# Patient Record
Sex: Female | Born: 1975 | Race: Black or African American | Hispanic: No | Marital: Single | Smoking: Never smoker
Health system: Southern US, Community
[De-identification: ages and names within clinical notes are randomized; demographics above are authoritative.]

---

## 1999-08-28 ENCOUNTER — Encounter: Payer: Self-pay | Admitting: Emergency Medicine

## 1999-08-28 ENCOUNTER — Emergency Department (HOSPITAL_COMMUNITY): Admission: EM | Admit: 1999-08-28 | Discharge: 1999-08-28 | Payer: Self-pay | Admitting: Emergency Medicine

## 1999-09-05 ENCOUNTER — Emergency Department (HOSPITAL_COMMUNITY): Admission: EM | Admit: 1999-09-05 | Discharge: 1999-09-05 | Payer: Self-pay | Admitting: Emergency Medicine

## 2014-06-21 ENCOUNTER — Ambulatory Visit (INDEPENDENT_AMBULATORY_CARE_PROVIDER_SITE_OTHER): Payer: BC Managed Care – PPO | Admitting: Family Medicine

## 2014-06-21 VITALS — BP 130/78 | HR 87 | Temp 97.9°F | Resp 17 | Ht 67.0 in | Wt 184.8 lb

## 2014-06-21 DIAGNOSIS — T148 Other injury of unspecified body region: Secondary | ICD-10-CM

## 2014-06-21 DIAGNOSIS — W57XXXA Bitten or stung by nonvenomous insect and other nonvenomous arthropods, initial encounter: Secondary | ICD-10-CM | POA: Diagnosis not present

## 2014-06-21 DIAGNOSIS — T63421A Toxic effect of venom of ants, accidental (unintentional), initial encounter: Secondary | ICD-10-CM

## 2014-06-21 MED ORDER — TRIAMCINOLONE ACETONIDE 0.1 % EX CREA: 1.0000 "application " | TOPICAL_CREAM | Freq: Three times a day (TID) | CUTANEOUS | Status: AC

## 2014-06-21 MED ORDER — PREDNISONE 20 MG PO TABS: ORAL_TABLET | ORAL | Status: DC

## 2014-06-21 NOTE — Patient Instructions (Signed)
Take the prednisone 3 pills daily for 4 days  Take over-the-counter Zyrtec (cetirizine) as per directions on the container  Use the triamcinolone cream on the bites 2 or 3 times daily.  If you see a red streaks going up the arm, the hand getting redder, or have other concerns please contact us or return

## 2014-06-21 NOTE — Progress Notes (Signed)
  Subjective:  Patient ID: Angie Rodriguez, female    DOB: [birth year]  Age: 39 y.o. MRN: [id number]  39 year old lady who was working in her yard this weekend and got stung on the right hand by several fire ants. She knows she got at least 3 stings. She was moving a piece of wooden noticed them on her hands but it was too late. She has swollen in her right hand and it felt hot to touch. Now it is starting to itch more. She did take Benadryl. She also put ice to it. No history of prior stings.  Assessment cycle was one week ago and patient is not pregnant. She is not diabetic. She is not on any regular medicines.   Objective:   Pleasant lady alert and oriented no acute distress except for the hand bothering her. She has swelling of the right hand in primarily on the dorsum of the hand but down into the fingers some also. There are at least 3 distinct bites, one on the knuckles, one on the back of the right wrist and one on the front of the right wrist it is warm to touch. No ascending erythema. No axillary nodes.  Assessment & Plan:   Assessment: Fire ants stings  Plan: Treat with prednisone and triamcinolone cream and cetirizine Patient Instructions  Take the prednisone 3 pills daily for 4 days  Take over-the-counter Zyrtec (cetirizine) as per directions on the container  Use the triamcinolone cream on the bites 2 or 3 times daily.  If you see a red streaks going up the arm, the hand getting redder, or have other concerns please contact us or return     Angie Rotz, MD 06/21/2014

## 2014-12-07 ENCOUNTER — Ambulatory Visit (INDEPENDENT_AMBULATORY_CARE_PROVIDER_SITE_OTHER): Payer: BC Managed Care – PPO | Admitting: Physician Assistant

## 2014-12-07 VITALS — BP 124/86 | HR 79 | Temp 98.2°F | Resp 16 | Ht 67.0 in | Wt 188.8 lb

## 2014-12-07 DIAGNOSIS — M6740 Ganglion, unspecified site: Secondary | ICD-10-CM | POA: Diagnosis not present

## 2014-12-07 LAB — POCT SEDIMENTATION RATE: POCT SED RATE: 3 mm/hr (ref 0–22)

## 2014-12-07 LAB — C-REACTIVE PROTEIN: CRP: <0.5 mg/dL (ref <0.60)

## 2014-12-07 NOTE — Progress Notes (Signed)
   12/07/2014 10:18 AM   DOB: 02/03/1991 / MRN: [id number]  SUBJECTIVE:  Angie SorrowRachel Sopka is a 39 y.o. female presenting for a painless mass on her posterior left hand that started 4 days ago.  She denies pain of the mass, paresthesia distal to the injury, and any change in function of the hand.  She has never had this problem before and is concerned.  The lump was preceded with yard work on Saturday, which is not new for her.  No insect bites and no pruritis.   She has no allergies on file.   She  has no past medical history on file.    She   She  has no sexual activity history on file. The patient  has no past surgical history on file.  Her family history is not on file.  Review of Systems  Constitutional: Negative for fever and chills.  Eyes: Negative for blurred vision.  Respiratory: Negative for cough and shortness of breath.   Cardiovascular: Negative for chest pain.  Gastrointestinal: Negative for nausea and abdominal pain.  Genitourinary: Negative for dysuria, urgency and frequency.  Musculoskeletal: Negative for myalgias and joint pain.  Skin: Negative for rash.  Neurological: Negative for dizziness, tingling and headaches.  Psychiatric/Behavioral: Negative for depression. The patient is not nervous/anxious.      Problem list and medications reviewed and updated by myself where necessary, and exist elsewhere in the encounter.     OBJECTIVE:  Filed Vitals:   12/07/14 1006  BP: 124/86  Pulse: 79  Temp: 98.2 F (36.8 C)  Resp: 16   Physical Exam  Constitutional: She is oriented to person, place, and time. She appears well-developed.  Eyes: EOM are normal. Pupils are equal, round, and reactive to light.  Cardiovascular: Normal rate.   Pulmonary/Chest: Effort normal.  Abdominal: She exhibits no distension.  Musculoskeletal: Normal range of motion.       Left wrist: She exhibits normal range of motion, no tenderness, no bony tenderness, no swelling, no effusion, no  crepitus and no deformity.       Left hand: She exhibits normal range of motion, no tenderness, no bony tenderness, no deformity and no laceration. Normal sensation noted. Normal strength noted.  Neurological: She is alert and oriented to person, place, and time. No cranial nerve deficit.  Skin: Skin is warm and dry. She is not diaphoretic.  Psychiatric: She has a normal mood and affect.  Vitals reviewed.     There were no vitals taken for this visit. CrCl cannot be calculated (Unknown ideal weight.).  Physical Exam  No results found for this or any previous visit (from the past 48 hour(s)).  ASSESSMENT AND PLAN  Diagnoses and all orders for this visit:  Ganglion cyst: Painless mass appearing after physical activity 4 days ago.  There is no tenderness, pulses 2+ at radial and ulnar.  No paresthesia and wrist and hand are normal.  Advised that we give this more time.  Will ultrasound the mass if she becomes concerned.      The patient was advised to call or return to clinic if she does not see an improvement in symptoms or to seek the care of the closest emergency department if she worsens with the above plan.   Deliah BostonMichael Caedin Mogan, MHS, PA-C [facility] 12/07/2014 10:18 AM

## 2016-05-30 ENCOUNTER — Encounter: Payer: Self-pay | Admitting: Physician Assistant

## 2016-05-30 ENCOUNTER — Ambulatory Visit (INDEPENDENT_AMBULATORY_CARE_PROVIDER_SITE_OTHER): Payer: BC Managed Care – PPO | Admitting: Physician Assistant

## 2016-05-30 VITALS — BP 138/100 | HR 104 | Temp 98.4°F | Resp 16 | Ht 65.55 in | Wt 192.6 lb

## 2016-05-30 DIAGNOSIS — K529 Noninfective gastroenteritis and colitis, unspecified: Secondary | ICD-10-CM | POA: Diagnosis not present

## 2016-05-30 DIAGNOSIS — R197 Diarrhea, unspecified: Secondary | ICD-10-CM | POA: Diagnosis not present

## 2016-05-30 DIAGNOSIS — R112 Nausea with vomiting, unspecified: Secondary | ICD-10-CM | POA: Diagnosis not present

## 2016-05-30 LAB — POCT CBC
Granulocyte percent: 67.3 %G (ref 37–80)
HCT, POC: 40.1 % (ref 37.7–47.9)
Hemoglobin: 13.9 g/dL (ref 12.2–16.2)
Lymph, poc: 2.4 (ref 0.6–3.4)
MCH, POC: 28.2 pg (ref 27–31.2)
MCHC: 34.8 g/dL (ref 31.8–35.4)
MCV: 81 fL (ref 80–97)
MID (cbc): 0.2 (ref 0–0.9)
MPV: 7.4 fL (ref 0–99.8)
POC Granulocyte: 5.3 (ref 2–6.9)
POC LYMPH PERCENT: 29.8 %L (ref 10–50)
POC MID %: 2.9 %M (ref 0–12)
Platelet Count, POC: 389 10*3/uL (ref 142–424)
RBC: 4.94 M/uL (ref 4.04–5.48)
RDW, POC: 12.8 %
WBC: 7.9 10*3/uL (ref 4.6–10.2)

## 2016-05-30 LAB — POCT URINALYSIS DIP (MANUAL ENTRY)
Bilirubin, UA: NEGATIVE
Blood, UA: NEGATIVE
Glucose, UA: NEGATIVE mg/dL
Ketones, POC UA: NEGATIVE mg/dL
Leukocytes, UA: NEGATIVE
Nitrite, UA: NEGATIVE
Protein Ur, POC: NEGATIVE mg/dL
Spec Grav, UA: 1.015 (ref 1.010–1.025)
Urobilinogen, UA: 0.2 E.U./dL
pH, UA: 6 (ref 5.0–8.0)

## 2016-05-30 LAB — POCT URINE PREGNANCY: Preg Test, Ur: NEGATIVE

## 2016-05-30 MED ORDER — ONDANSETRON 8 MG PO TBDP: 8.0000 mg | ORAL_TABLET | Freq: Three times a day (TID) | ORAL | 0 refills | Status: AC | PRN

## 2016-05-30 NOTE — Progress Notes (Signed)
PRIMARY CARE AT [facility] [address] [place] [address] [phone]  Date:  05/30/2016   Name:  Angie Rodriguez   DOB:  05/11/1975   MRN:  [id number]  PCP:  Patient, No Pcp Per    History of Present Illness:  Angie Rodriguez is a 41 y.o. female patient who presents to PCP with  Chief Complaint  Patient presents with  . Dizziness    began yesterday morning  . Emesis    began yesterday morning     Dizzy yesterday morning, and ran to the bathroom to throw up.  She felt better by the late afternoon.  When she would get up she would feel dizzy.  The dizziness dissipated today.  She had a hot dog yesterday morning.  No bloody emesis.  She had multiple bowel movements but no loose stool.  No abdominal pain.  No hematuria, dysuria, or frequency.   Sexually active with protection.   Patient's last menstrual period was 05/17/2016. She was able to tolerate foods by the evening.     There are no active problems to display for this patient.   No past medical history on file.  No past surgical history on file.  Social History  Substance Use Topics  . Smoking status: Never Smoker  . Smokeless tobacco: Never Used  . Alcohol use No    Family History  Problem Relation Age of Onset  . Hypertension Mother   . Stroke Mother   . Heart disease Father     No Known Allergies  Medication list has been reviewed and updated.  Current Outpatient Prescriptions on File Prior to Visit  Medication Sig Dispense Refill  . triamcinolone cream (KENALOG) 0.1 % Apply 1 application topically 3 (three) times daily. (Patient not taking: Reported on 12/07/2014) 30 g 0   No current facility-administered medications on file prior to visit.     ROS ROS otherwise unremarkable unless listed above.  Physical Examination: BP (!) 138/100 (BP Location: Right Arm, Patient Position: Sitting, Cuff Size: Normal)   Pulse (!) 104   Temp 98.4 F (36.9 C) (Oral)   Resp 16   Ht 5' 5.55" (1.665 m)    Wt 192 lb 9.6 oz (87.4 kg)   LMP 05/17/2016   SpO2 96%   BMI 31.51 kg/m  Ideal Body Weight: Weight in (lb) to have BMI = 25: 152.5  Physical Exam  Constitutional: She is oriented to person, place, and time. She appears well-developed and well-nourished. No distress.  HENT:  Head: Normocephalic and atraumatic.  Right Ear: External ear normal.  Left Ear: External ear normal.  Eyes: Conjunctivae and EOM are normal. Pupils are equal, round, and reactive to light.  Cardiovascular: Normal rate.   Pulmonary/Chest: Effort normal. No respiratory distress.  Abdominal: Soft. Normal appearance and bowel sounds are normal. There is no hepatosplenomegaly. There is no tenderness. There is no CVA tenderness.  Neurological: She is alert and oriented to person, place, and time.  Skin: She is not diaphoretic.  Psychiatric: She has a normal mood and affect. Her behavior is normal.   Results for orders placed or performed in visit on 05/30/16  POCT urinalysis dipstick  Result Value Ref Range   Color, UA yellow yellow   Clarity, UA clear clear   Glucose, UA negative negative mg/dL   Bilirubin, UA negative negative   Ketones, POC UA negative negative mg/dL   Spec Grav, UA 5.784 6.962 - 1.025   Blood, UA negative negative  pH, UA 6.0 5.0 - 8.0   Protein Ur, POC negative negative mg/dL   Urobilinogen, UA 0.2 0.2 or 1.0 E.U./dL   Nitrite, UA Negative Negative   Leukocytes, UA Negative Negative  POCT urine pregnancy  Result Value Ref Range   Preg Test, Ur Negative Negative  POCT CBC  Result Value Ref Range   WBC 7.9 4.6 - 10.2 K/uL   Lymph, poc 2.4 0.6 - 3.4   POC LYMPH PERCENT 29.8 10 - 50 %L   MID (cbc) 0.2 0 - 0.9   POC MID % 2.9 0 - 12 %M   POC Granulocyte 5.3 2 - 6.9   Granulocyte percent 67.3 37 - 80 %G   RBC 4.94 4.04 - 5.48 M/uL   Hemoglobin 13.9 12.2 - 16.2 g/dL   HCT, POC 16.140.1 09.637.7 - 47.9 %   MCV 81.0 80 - 97 fL   MCH, POC 28.2 27 - 31.2 pg   MCHC 34.8 31.8 - 35.4 g/dL   RDW,  POC 04.512.8 %   Platelet Count, POC 389 142 - 424 K/uL   MPV 7.4 0 - 99.8 fL     Assessment and Plan: Angie Rodriguez is a 41 y.o. female who is here today or dizziness and nausea Likely a gastroenteritis.   Will return to the clinic if her symptoms are not improving in 72 hours.   Discussed alarming sxs to warrant an immediate return.   Gastroenteritis  Nausea and vomiting, intractability of vomiting not specified, unspecified vomiting type - Plan: POCT urinalysis dipstick, POCT urine pregnancy, POCT CBC, ondansetron (ZOFRAN-ODT) 8 MG disintegrating tablet  Trena PlattStephanie Maxfield Gildersleeve, PA-C [facility] 5/19/20189:29 AM

## 2016-05-30 NOTE — Patient Instructions (Addendum)
IF you received an x-ray today, you will receive an invoice from [facility]. Please contact [facility] at [phone] with questions or concerns regarding your invoice.   IF you received labwork today, you will receive an invoice from [facility]. Please contact [facility] at [phone] with questions or concerns regarding your invoice.   Our billing staff will not be able to assist you with questions regarding bills from these companies.  You will be contacted with the lab results as soon as they are available. The fastest way to get your results is to activate your My Chart account. Instructions are located on the last page of this paperwork. If you have not heard from us regarding the results in 2 weeks, please contact this office.    Continue to hydrate well with water. Viral Gastroenteritis, Adult Viral gastroenteritis is also known as the stomach flu. This condition is caused by certain germs (viruses). These germs can be passed from person to person very easily (are very contagious). This condition can cause sudden watery poop (diarrhea), fever, and throwing up (vomiting). Having watery poop and throwing up can make you feel weak and cause you to get dehydrated. Dehydration can make you tired and thirsty, make you have a dry mouth, and make it so you pee (urinate) less often. Older adults and people with other diseases or a weak defense system (immune system) are at higher risk for dehydration. It is important to replace the fluids that you lose from having watery poop and throwing up. Follow these instructions at home: Follow instructions from your doctor about how to care for yourself at home. Eating and drinking   Follow these instructions as told by your doctor:  Take an oral rehydration solution (ORS). This is a drink that is sold at pharmacies and stores.  Drink clear fluids in small amounts as you are able, such as:  Water.  Ice chips.  Diluted fruit  juice.  Low-calorie sports drinks.  Eat bland, easy-to-digest foods in small amounts as you are able, such as:  Bananas.  Applesauce.  Rice.  Low-fat (lean) meats.  Toast.  Crackers.  Avoid fluids that have a lot of sugar or caffeine in them.  Avoid alcohol.  Avoid spicy or fatty foods. General instructions   Drink enough fluid to keep your pee (urine) clear or pale yellow.  Wash your hands often. If you cannot use soap and water, use hand sanitizer.  Make sure that all people in your home wash their hands well and often.  Rest at home while you get better.  Take over-the-counter and prescription medicines only as told by your doctor.  Watch your condition for any changes.  Take a warm bath to help with any burning or pain from having watery poop.  Keep all follow-up visits as told by your doctor. This is important. Contact a doctor if:  You cannot keep fluids down.  Your symptoms get worse.  You have new symptoms.  You feel light-headed or dizzy.  You have muscle cramps. Get help right away if:  You have chest pain.  You feel very weak or you pass out (faint).  You see blood in your throw-up.  Your throw-up looks like coffee grounds.  You have bloody or black poop (stools) or poop that look like tar.  You have a very bad headache, a stiff neck, or both.  You have a rash.  You have very bad pain, cramping, or bloating in your belly (abdomen).  You have trouble breathing.  You are breathing very quickly.  Your heart is beating very quickly.  Your skin feels cold and clammy.  You feel confused.  You have pain when you pee.  You have signs of dehydration, such as:  Dark pee, hardly any pee, or no pee.  Cracked lips.  Dry mouth.  Sunken eyes.  Sleepiness.  Weakness. This information is not intended to replace advice given to you by your health care provider. Make sure you discuss any questions you have with your health care  provider. Document Released: 06/19/2007 Document Revised: 07/21/2015 Document Reviewed: 09/06/2014 Elsevier Interactive Patient Education  2017 [employer].

## 2017-04-15 ENCOUNTER — Encounter: Payer: Self-pay | Admitting: Physician Assistant

## 2017-09-15 ENCOUNTER — Ambulatory Visit (HOSPITAL_COMMUNITY)
Admission: EM | Admit: 2017-09-15 | Discharge: 2017-09-15 | Disposition: A | Discharge status: Home / Self Care | Payer: BC Managed Care – PPO | Attending: Family Medicine | Admitting: Family Medicine

## 2017-09-15 ENCOUNTER — Other Ambulatory Visit: Payer: Self-pay | Admitting: Family Medicine

## 2017-09-15 ENCOUNTER — Encounter (HOSPITAL_COMMUNITY): Payer: Self-pay | Admitting: Emergency Medicine

## 2017-09-15 DIAGNOSIS — R030 Elevated blood-pressure reading, without diagnosis of hypertension: Secondary | ICD-10-CM

## 2017-09-15 DIAGNOSIS — N63 Unspecified lump in unspecified breast: Secondary | ICD-10-CM

## 2017-09-15 DIAGNOSIS — N6310 Unspecified lump in the right breast, unspecified quadrant: Secondary | ICD-10-CM

## 2017-09-15 NOTE — Discharge Instructions (Signed)
It was nice seeing you today. Please call breast center imaging to schedule diagnostic mammogram. I have placed an order. Please establish PCP care soon for BP and breast imaging result management.

## 2017-09-15 NOTE — ED Provider Notes (Addendum)
[facility]    CSN: [id number] Arrival date & time: 09/15/17  7829     History   Chief Complaint Chief Complaint  Patient presents with  . Mass    HPI Angie Rodriguez is a 42 y.o. female.   The history is provided by the patient. No language interpreter was used.  Breast lesion: Lump on her right breast x 2 days. She noticed it after she was outside in the yard working. She denies insect bite, no trauma to her breast, no pain, no redness. Denies hx of breast disorder, no family hx of breast cancer. Elevated BP: denies hx of HTN. No Neuro, cardiopulmonary symptoms at the moment.     No past medical history on file.  There are no active problems to display for this patient.   No past surgical history on file.  OB History   None      Home Medications    Prior to Admission medications   Medication Sig Start Date End Date Taking? Authorizing Provider  ondansetron (ZOFRAN-ODT) 8 MG disintegrating tablet Take 1 tablet (8 mg total) by mouth every 8 (eight) hours as needed for nausea. 05/30/16   Trena Platt D, PA  triamcinolone cream (KENALOG) 0.1 % Apply 1 application topically 3 (three) times daily. Patient not taking: Reported on 12/07/2014 06/21/14   Peyton Najjar, MD    Family History Family History  Problem Relation Age of Onset  . Hypertension Mother   . Stroke Mother   . Heart disease Father     Social History Social History   Tobacco Use  . Smoking status: Never Smoker  . Smokeless tobacco: Never Used  Substance Use Topics  . Alcohol use: No    Alcohol/week: 0.0 standard drinks  . Drug use: No     Allergies   Patient has no known allergies.   Review of Systems Review of Systems  Constitutional: Negative.   Respiratory: Negative.   Cardiovascular: Negative.   Gastrointestinal: Negative.   Genitourinary: Negative.   Skin:       Right breast lump  Neurological: Negative.   Hematological: Negative.     Psychiatric/Behavioral:       Nervous being in the [facility]  All other systems reviewed and are negative.    Physical Exam Triage Vital Signs ED Triage Vitals  Enc Vitals Group     BP      Pulse      Resp      Temp      Temp src      SpO2      Weight      Height      Head Circumference      Peak Flow      Pain Score      Pain Loc      Pain Edu?      Excl. in GC?    No data found.  Updated Vital Signs There were no vitals taken for this visit.  Visual Acuity Right Eye Distance:   Left Eye Distance:   Bilateral Distance:    Right Eye Near:   Left Eye Near:    Bilateral Near:     Physical Exam  Constitutional: She is oriented to person, place, and time. No distress.  Cardiovascular: Normal rate, regular rhythm and normal heart sounds.  No murmur heard. Pulmonary/Chest: Effort normal and breath sounds normal. No stridor. No respiratory distress. She has no wheezes. She exhibits no mass and no tenderness.  Right breast exhibits mass. Right breast exhibits no inverted nipple, no nipple discharge, no skin change and no tenderness. Left breast exhibits no inverted nipple, no mass, no nipple discharge, no skin change and no tenderness. No breast swelling, tenderness, discharge or bleeding. Breasts are symmetrical.    Abdominal: Soft. Bowel sounds are normal. She exhibits no distension.  Musculoskeletal: She exhibits no edema.  Neurological: She is alert and oriented to person, place, and time. No cranial nerve deficit.  Nursing note and vitals reviewed.    UC Treatments / Results  Labs (all labs ordered are listed, but only abnormal results are displayed) Labs Reviewed - No data to display  EKG None  Radiology No results found.  Procedures Procedures (including critical care time)  Medications Ordered in UC Medications - No data to display  Initial Impression / Assessment and Plan / UC Course  I have reviewed the triage vital signs and the nursing  notes.  Pertinent labs & imaging results that were available during my care of the patient were reviewed by me and considered in my medical decision making (see chart for details).  Clinical Course as of Sep 15 1052  Mon Sep 15, 2017  1045 Right breast lump. R/O Malignancy. Breast imaging recommended. Order placed. She is instructed to call breast imaging center to schedule mammogram. F/U with [place] as needed.   [KE]  1045 BP persistently elevated but improved some after rest., Repeat BP by me : 153/100 then 156/104. She stated she was diagnosed with elevated BP in the past and was not treated. She also feels anxious now. No neurologic symptoms or cardiopulm symptoms. Unclear if elevated BP is due to anxiety. Monitor BP closely at home. See PCP or urgent care in 1 week with BP dairy. Consider antihypertensive agents if persistently elevated.   [KE]    Clinical Course User Index [KE] Doreene Eland, MD    Breast lump in female  Elevated blood pressure reading   Final Clinical Impressions(s) / UC Diagnoses   Final diagnoses:  None   Discharge Instructions   None    ED Prescriptions    None     Controlled Substance Prescriptions [place] Controlled Substance Registry consulted? Not Applicable   Doreene Eland, MD 09/15/17 1054    Doreene Eland, MD 09/15/17 1353

## 2017-09-15 NOTE — ED Triage Notes (Signed)
Pt states she was out in the yard Saturday with a tank top on and something bit her on top of her L breast.

## 2017-09-16 ENCOUNTER — Other Ambulatory Visit: Payer: Self-pay | Admitting: Family Medicine

## 2017-09-16 DIAGNOSIS — N6310 Unspecified lump in the right breast, unspecified quadrant: Secondary | ICD-10-CM

## 2017-09-22 ENCOUNTER — Telehealth: Payer: Self-pay | Admitting: Family Medicine

## 2017-09-22 ENCOUNTER — Ambulatory Visit
Admission: RE | Admit: 2017-09-22 | Discharge: 2017-09-22 | Disposition: A | Discharge status: Home / Self Care | Payer: BC Managed Care – PPO | Source: Ambulatory Visit | Attending: Family Medicine | Admitting: Family Medicine

## 2017-09-22 ENCOUNTER — Other Ambulatory Visit: Payer: Self-pay | Admitting: Family Medicine

## 2017-09-22 DIAGNOSIS — N6310 Unspecified lump in the right breast, unspecified quadrant: Secondary | ICD-10-CM

## 2017-09-22 NOTE — Telephone Encounter (Signed)
Unable to reach and message not left on her phone.  Note: Mammogram result is normal.   I had discussed with her during her visit to urgent care that she need to establish PCP to follow her management. She is aware that her subsequent referral and imaging will not come from our office since I am not her PCP. I will send a reminder letter t her.

## 2017-09-23 ENCOUNTER — Encounter: Payer: Self-pay | Admitting: Family Medicine

## 2017-09-23 NOTE — Progress Notes (Signed)
Unable to reach and message not left on her phone.  Note: Mammogram result is normal.   I had discussed with her during her visit to urgent care that she need to establish PCP to follow her management. She is aware that her subsequent referral and imaging will not come from our office since I am not her PCP. I will send a reminder letter t her. 

## 2017-12-23 ENCOUNTER — Other Ambulatory Visit: Payer: Self-pay | Admitting: Family Medicine

## 2017-12-23 ENCOUNTER — Ambulatory Visit
Admission: RE | Admit: 2017-12-23 | Discharge: 2017-12-23 | Disposition: A | Discharge status: Home / Self Care | Payer: BC Managed Care – PPO | Source: Ambulatory Visit | Attending: Family Medicine | Admitting: Family Medicine

## 2017-12-23 DIAGNOSIS — N6310 Unspecified lump in the right breast, unspecified quadrant: Secondary | ICD-10-CM

## 2018-03-24 ENCOUNTER — Other Ambulatory Visit: Payer: BC Managed Care – PPO

## 2018-03-26 ENCOUNTER — Other Ambulatory Visit: Payer: BC Managed Care – PPO

## 2018-04-24 ENCOUNTER — Ambulatory Visit
Admission: RE | Admit: 2018-04-24 | Discharge: 2018-04-24 | Disposition: A | Discharge status: Home / Self Care | Payer: BC Managed Care – PPO | Source: Ambulatory Visit | Attending: Family Medicine | Admitting: Family Medicine

## 2018-04-24 ENCOUNTER — Other Ambulatory Visit: Payer: Self-pay | Admitting: Family Medicine

## 2018-04-24 ENCOUNTER — Other Ambulatory Visit: Payer: Self-pay

## 2018-04-24 ENCOUNTER — Other Ambulatory Visit: Payer: BC Managed Care – PPO

## 2018-04-24 DIAGNOSIS — N6310 Unspecified lump in the right breast, unspecified quadrant: Secondary | ICD-10-CM

## 2018-04-24 DIAGNOSIS — M7989 Other specified soft tissue disorders: Secondary | ICD-10-CM

## 2018-09-28 ENCOUNTER — Other Ambulatory Visit: Payer: Self-pay

## 2018-09-28 ENCOUNTER — Ambulatory Visit
Admission: RE | Admit: 2018-09-28 | Discharge: 2018-09-28 | Disposition: A | Discharge status: Home / Self Care | Payer: BC Managed Care – PPO | Source: Ambulatory Visit | Attending: Family Medicine | Admitting: Family Medicine

## 2018-09-28 DIAGNOSIS — M7989 Other specified soft tissue disorders: Secondary | ICD-10-CM

## 2018-10-18 ENCOUNTER — Encounter (HOSPITAL_COMMUNITY): Payer: Self-pay

## 2018-10-18 ENCOUNTER — Ambulatory Visit (HOSPITAL_COMMUNITY)
Admission: EM | Admit: 2018-10-18 | Discharge: 2018-10-18 | Disposition: A | Discharge status: Home / Self Care | Payer: BC Managed Care – PPO

## 2018-10-18 ENCOUNTER — Other Ambulatory Visit: Payer: Self-pay

## 2018-10-18 DIAGNOSIS — T1591XA Foreign body on external eye, part unspecified, right eye, initial encounter: Secondary | ICD-10-CM | POA: Diagnosis not present

## 2018-10-18 MED ORDER — FLUORESCEIN SODIUM 1 MG OP STRP
ORAL_STRIP | OPHTHALMIC | Status: AC
  Filled 2018-10-18: qty 1

## 2018-10-18 MED ORDER — TETRACAINE HCL 0.5 % OP SOLN
OPHTHALMIC | Status: AC
  Filled 2018-10-18: qty 4

## 2018-10-18 NOTE — ED Triage Notes (Signed)
Pt states she's not sure if she has a lost contact in her right eye. Pt states she's not sure if  It's still there or not.

## 2018-10-18 NOTE — ED Provider Notes (Signed)
[facility]    CSN: [id number] Arrival date & time: 10/18/18  1008      History   Chief Complaint Chief Complaint  Patient presents with  . Eye Pain    HPI Angie Rodriguez is a 43 y.o. female.   Patient here concerned with "contact lost in R eye" x this morning. She reports she accidentally fell asleep in her contacts, she was able to find and remove contact from L eye at home but could not find R contact.  She denies foreign body sensation, pain, tenderness, blurry vision, double vision.  She is wearing her glasses.       History reviewed. No pertinent past medical history.  There are no active problems to display for this patient.   History reviewed. No pertinent surgical history.  OB History   No obstetric history on file.      Home Medications    Prior to Admission medications   Medication Sig Start Date End Date Taking? Authorizing Provider  ondansetron (ZOFRAN-ODT) 8 MG disintegrating tablet Take 1 tablet (8 mg total) by mouth every 8 (eight) hours as needed for nausea. 05/30/16   Trena PlattEnglish, Stephanie D, PA  triamcinolone cream (KENALOG) 0.1 % Apply 1 application topically 3 (three) times daily. Patient not taking: Reported on 12/07/2014 06/21/14   Peyton NajjarHopper, David H, MD    Family History Family History  Problem Relation Age of Onset  . Hypertension Mother   . Stroke Mother   . Heart disease Father   . Breast cancer Neg Hx     Social History Social History   Tobacco Use  . Smoking status: Never Smoker  . Smokeless tobacco: Never Used  Substance Use Topics  . Alcohol use: No    Alcohol/week: 0.0 standard drinks  . Drug use: No     Allergies   Patient has no known allergies.   Review of Systems Review of Systems  Constitutional: Negative for activity change, appetite change and fatigue.  HENT: Negative for rhinorrhea and sore throat.   Eyes: Positive for redness. Negative for photophobia, pain, discharge, itching and visual  disturbance.  Respiratory: Negative for cough, shortness of breath and wheezing.   Cardiovascular: Negative for chest pain.  Gastrointestinal: Negative for nausea and vomiting.  Musculoskeletal: Negative for myalgias.  Skin: Negative for rash.  Allergic/Immunologic: Negative for immunocompromised state.  Hematological: Negative for adenopathy. Does not bruise/bleed easily.  Psychiatric/Behavioral: Negative for confusion and sleep disturbance. The patient is not nervous/anxious.      Physical Exam Triage Vital Signs ED Triage Vitals  Enc Vitals Group     BP 10/18/18 1031 (!) 154/101     Pulse Rate 10/18/18 1031 97     Resp 10/18/18 1031 16     Temp 10/18/18 1031 97.8 F (36.6 C)     Temp Source 10/18/18 1031 Temporal     SpO2 10/18/18 1031 100 %     Weight 10/18/18 1039 180 lb (81.6 kg)     Height --      Head Circumference --      Peak Flow --      Pain Score 10/18/18 1039 0     Pain Loc --      Pain Edu? --      Excl. in GC? --    No data found.  Updated Vital Signs BP (!) 154/101 (BP Location: Left Arm)   Pulse 97   Temp 97.8 F (36.6 C) (Temporal)   Resp 16  Wt 180 lb (81.6 kg)   LMP 09/28/2018   SpO2 100%   BMI 29.45 kg/m   Visual Acuity Right Eye Distance:   Left Eye Distance:   Bilateral Distance:    Right Eye Near: R Near: 20/30 with corrective lens Left Eye Near:  L Near: 20/20 with corrective lens Bilateral Near:  20/20 with corrective lens  Physical Exam Vitals signs and nursing note reviewed.  Constitutional:      General: She is not in acute distress.    Appearance: Normal appearance. She is well-developed.  HENT:     Head: Normocephalic and atraumatic.     Nose: Nose normal.     Mouth/Throat:     Mouth: Mucous membranes are moist.  Eyes:     General: Lids are normal. Lids are everted, no foreign bodies appreciated. Vision grossly intact. Gaze aligned appropriately. No allergic shiner, visual field deficit or scleral icterus.       Right  eye: No foreign body or discharge.        Left eye: No foreign body or discharge.     Extraocular Movements: Extraocular movements intact.     Right eye: No nystagmus.     Left eye: No nystagmus.     Conjunctiva/sclera:     Right eye: Right conjunctiva is injected. No chemosis.    Left eye: Left conjunctiva is not injected.     Pupils: Pupils are equal, round, and reactive to light. Pupils are equal.     Right eye: No corneal abrasion or fluorescein uptake.     Comments: Fluorescein stain and woods lamp used to examine R eye - no foreign body notes, no corneal abrasion appreciated on exam.  Neck:     Musculoskeletal: Neck supple.  Cardiovascular:     Rate and Rhythm: Normal rate and regular rhythm.     Heart sounds: No murmur.  Pulmonary:     Effort: Pulmonary effort is normal. No respiratory distress.     Breath sounds: Normal breath sounds.  Abdominal:     Palpations: Abdomen is soft.     Tenderness: There is no abdominal tenderness.  Skin:    General: Skin is warm and dry.     Capillary Refill: Capillary refill takes less than 2 seconds.  Neurological:     General: No focal deficit present.     Mental Status: She is alert and oriented to person, place, and time.  Psychiatric:        Mood and Affect: Mood normal.        Behavior: Behavior normal.      UC Treatments / Results  Labs (all labs ordered are listed, but only abnormal results are displayed) Labs Reviewed - No data to display  EKG   Radiology No results found.  Procedures Procedures (including critical care time)  Medications Ordered in UC Medications  tetracaine (PONTOCAINE) 0.5 % ophthalmic solution (has no administration in time range)  fluorescein 1 MG ophthalmic strip (has no administration in time range)    Initial Impression / Assessment and Plan / UC Course  I have reviewed the triage vital signs and the nursing notes.  Pertinent labs & imaging results that were available during my care of  the patient were reviewed by me and considered in my medical decision making (see chart for details).      Final Clinical Impressions(s) / UC Diagnoses   Final diagnoses:  Foreign body of right eye, initial encounter     Discharge Instructions  No foreign body noted - if you experience pain or discomfort in eye follow up with eye specialist.     ED Prescriptions    None     PDMP not reviewed this encounter.   Evern Core, PA-C 10/18/18 1133

## 2018-10-18 NOTE — Discharge Instructions (Addendum)
No foreign body noted - if you experience pain or discomfort in eye follow up with eye specialist.

## 2020-07-06 ENCOUNTER — Other Ambulatory Visit: Payer: Self-pay | Admitting: Family Medicine

## 2020-07-06 DIAGNOSIS — Z1231 Encounter for screening mammogram for malignant neoplasm of breast: Secondary | ICD-10-CM

## 2020-08-30 ENCOUNTER — Ambulatory Visit
Admission: RE | Admit: 2020-08-30 | Discharge: 2020-08-30 | Disposition: A | Discharge status: Home / Self Care | Payer: BC Managed Care – PPO | Source: Ambulatory Visit | Attending: Family Medicine | Admitting: Family Medicine

## 2020-08-30 ENCOUNTER — Other Ambulatory Visit: Payer: Self-pay

## 2020-08-30 DIAGNOSIS — Z1231 Encounter for screening mammogram for malignant neoplasm of breast: Secondary | ICD-10-CM

## 2022-12-03 IMAGING — MG MM DIGITAL SCREENING BILAT W/ TOMO AND CAD
6 of 10 series · 6 of 30 positions shown · non-contrast
Comparison: Previous exam(s).

ACR Breast Density Category a: The breast tissue is almost entirely
fatty.

CLINICAL DATA: Screening.

EXAM:
DIGITAL SCREENING BILATERAL MAMMOGRAM WITH TOMOSYNTHESIS AND CAD
TECHNIQUE: Bilateral screening digital craniocaudal and mediolateral oblique
mammograms were obtained. Bilateral screening digital breast
tomosynthesis was performed. The images were evaluated with
computer-aided detection.

[L MLO synth-2D]
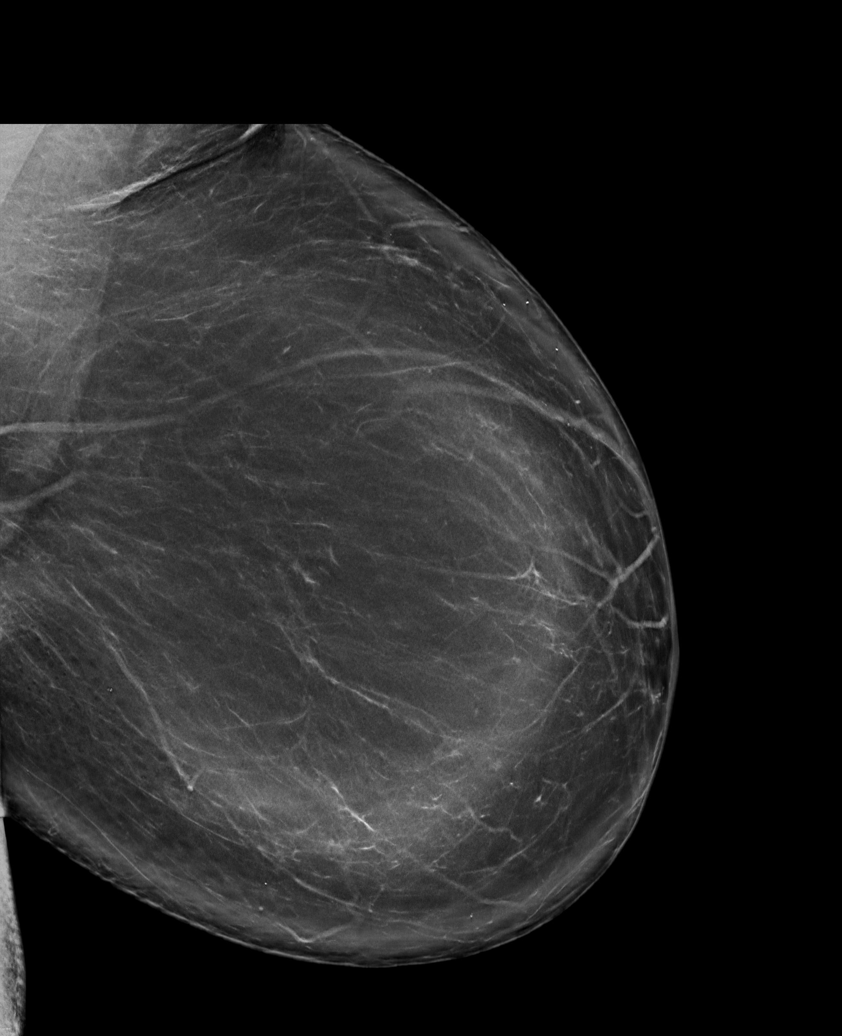

[L CC synth-2D]
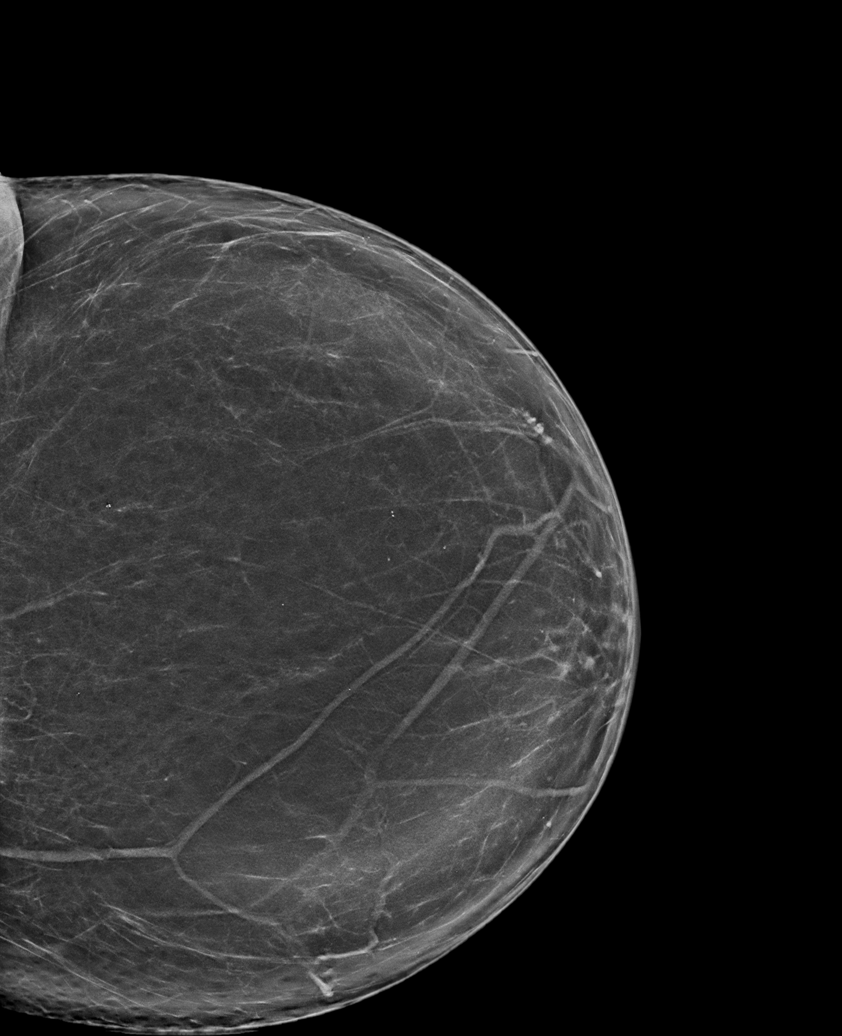

[R CV synth-2D]
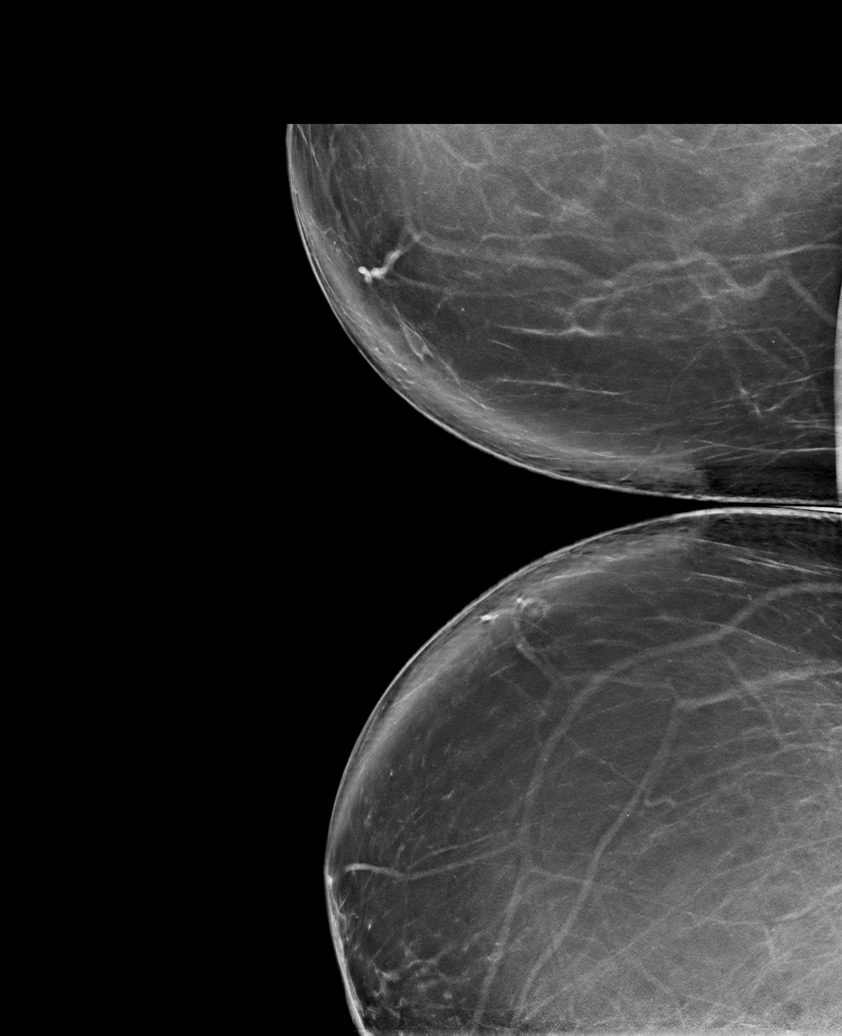

[R CC synth-2D]
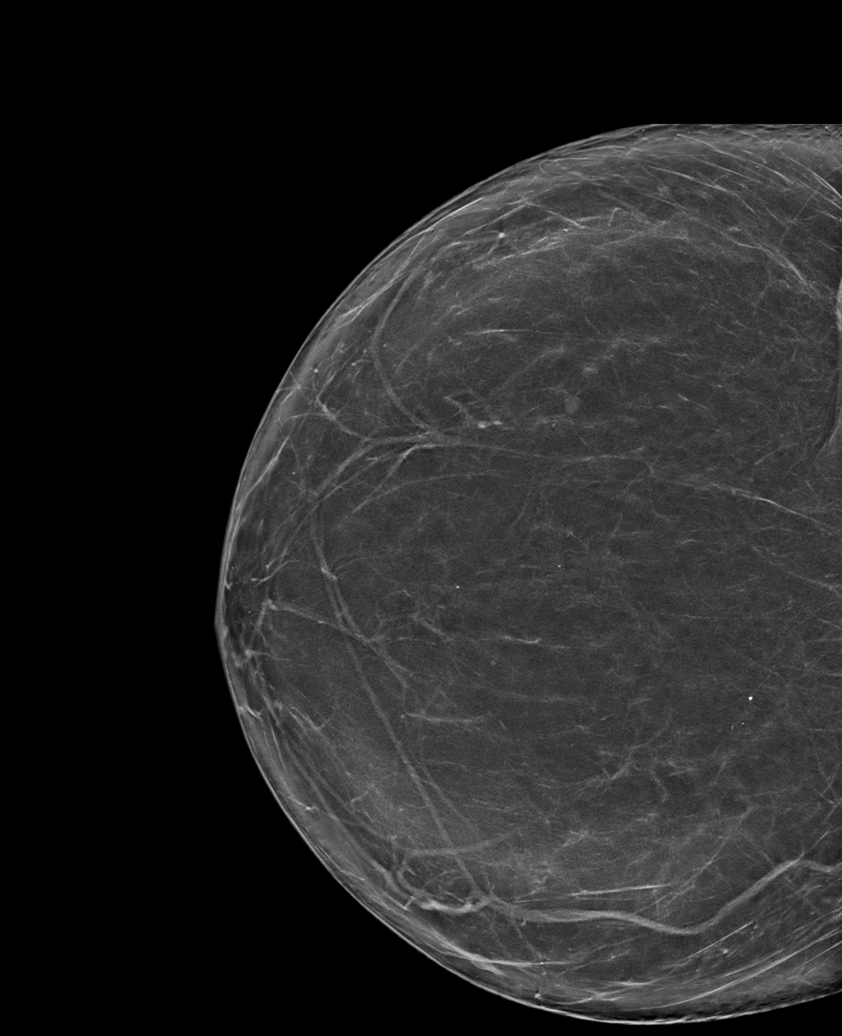

[R MLO synth-2D]
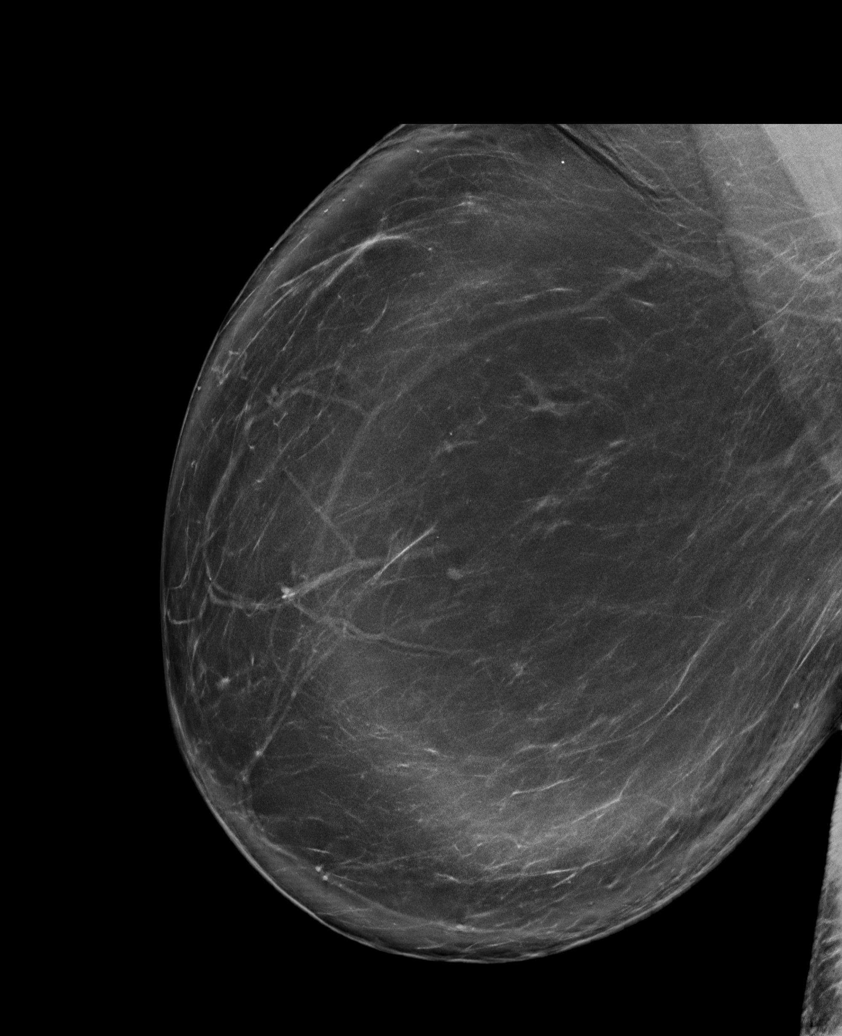

[R CC tomo · tomo slice 45/89.0]
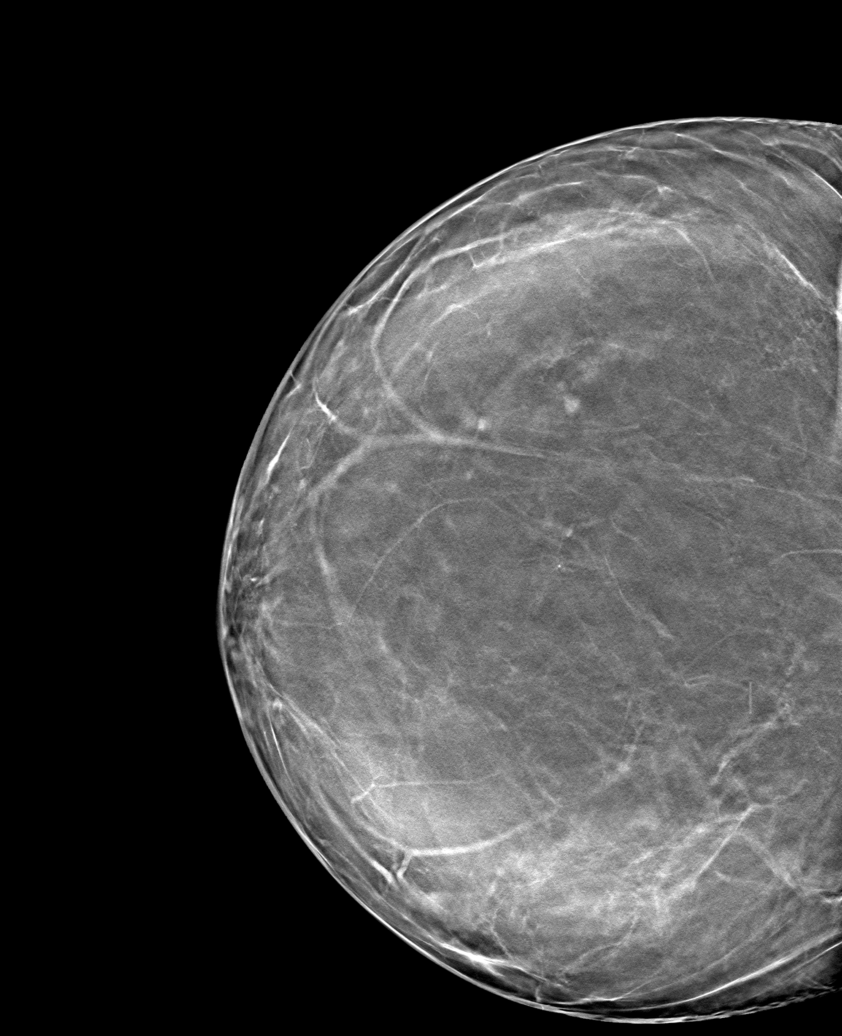

[6 of 30 positions shown; findings below may reference images not displayed]

FINDINGS: There are no findings suspicious for malignancy.
IMPRESSION: No mammographic evidence of malignancy. A result letter of this
screening mammogram will be mailed directly to the patient.

RECOMMENDATION:
Screening mammogram in one year. (Code:0E-3-N98)

BI-RADS CATEGORY  1: Negative.
